# Patient Record
Sex: Female | Born: 1975 | Race: White | Hispanic: No | Marital: Married
Health system: Southern US, Community
[De-identification: ages and names within clinical notes are randomized; demographics above are authoritative.]

---

## 2000-04-08 ENCOUNTER — Emergency Department (HOSPITAL_COMMUNITY): Admission: EM | Admit: 2000-04-08 | Discharge: 2000-04-08 | Payer: Self-pay | Admitting: Emergency Medicine

## 2003-01-06 ENCOUNTER — Other Ambulatory Visit: Admission: RE | Admit: 2003-01-06 | Discharge: 2003-01-06 | Payer: Self-pay | Admitting: Gynecology

## 2003-06-01 ENCOUNTER — Encounter: Admission: RE | Admit: 2003-06-01 | Discharge: 2003-06-01 | Payer: Self-pay | Admitting: Gynecology

## 2003-08-03 ENCOUNTER — Inpatient Hospital Stay (HOSPITAL_COMMUNITY): Admission: AD | Admit: 2003-08-03 | Discharge: 2003-08-07 | Payer: Self-pay | Admitting: Gynecology

## 2003-09-17 ENCOUNTER — Other Ambulatory Visit: Admission: RE | Admit: 2003-09-17 | Discharge: 2003-09-17 | Payer: Self-pay | Admitting: Gynecology

## 2004-05-02 ENCOUNTER — Other Ambulatory Visit: Admission: RE | Admit: 2004-05-02 | Discharge: 2004-05-02 | Payer: Self-pay | Admitting: Gynecology

## 2004-07-05 ENCOUNTER — Inpatient Hospital Stay (HOSPITAL_COMMUNITY): Admission: AD | Admit: 2004-07-05 | Discharge: 2004-07-05 | Payer: Self-pay | Admitting: Obstetrics and Gynecology

## 2004-09-02 ENCOUNTER — Inpatient Hospital Stay (HOSPITAL_COMMUNITY): Admission: AD | Admit: 2004-09-02 | Discharge: 2004-09-02 | Payer: Self-pay | Admitting: Obstetrics and Gynecology

## 2004-09-03 ENCOUNTER — Inpatient Hospital Stay (HOSPITAL_COMMUNITY): Admission: AD | Admit: 2004-09-03 | Discharge: 2004-09-03 | Payer: Self-pay | Admitting: Obstetrics and Gynecology

## 2004-10-29 ENCOUNTER — Inpatient Hospital Stay (HOSPITAL_COMMUNITY): Admission: AD | Admit: 2004-10-29 | Discharge: 2004-11-01 | Payer: Self-pay | Admitting: Family Medicine

## 2008-10-28 ENCOUNTER — Encounter (INDEPENDENT_AMBULATORY_CARE_PROVIDER_SITE_OTHER): Payer: Self-pay | Admitting: Obstetrics and Gynecology

## 2008-10-28 ENCOUNTER — Ambulatory Visit (HOSPITAL_COMMUNITY): Admission: RE | Admit: 2008-10-28 | Discharge: 2008-10-29 | Payer: Self-pay | Admitting: Obstetrics and Gynecology

## 2010-05-26 LAB — CBC
HCT: 29.5 % — ABNORMAL LOW (ref 36.0–46.0)
HCT: 37.6 % (ref 36.0–46.0)
Hemoglobin: 10.1 g/dL — ABNORMAL LOW (ref 12.0–15.0)
Hemoglobin: 12.9 g/dL (ref 12.0–15.0)
MCHC: 34.2 g/dL (ref 30.0–36.0)
MCV: 90.4 fL (ref 78.0–100.0)
Platelets: 133 10*3/uL — ABNORMAL LOW (ref 150–400)
Platelets: 184 10*3/uL (ref 150–400)
RBC: 3.24 MIL/uL — ABNORMAL LOW (ref 3.87–5.11)
RDW: 14.4 % (ref 11.5–15.5)
WBC: 8.6 10*3/uL (ref 4.0–10.5)

## 2010-05-26 LAB — ABO/RH: ABO/RH(D): O POS

## 2010-07-07 NOTE — Discharge Summary (Signed)
NAME:  Lauren Nash, Lauren Nash       ACCOUNT NO.:  [id number]   MEDICAL RECORD NO.:  [id number]          PATIENT TYPE:  INP   LOCATION:  9306                          FACILITY:  [facility]   PHYSICIAN:  Ivor Costa. Farrel Gobble, M.D. DATE OF BIRTH:  Feb 16, 1976   DATE OF ADMISSION:  10/29/2004  DATE OF DISCHARGE:  11/01/2004                                 DISCHARGE SUMMARY   PRINCIPAL DIAGNOSIS:  Postpartum fever, suspect endometritis.   PRINCIPAL PROCEDURE:  Antibiotic therapy.   HISTORY AND HOSPITAL COURSE:  Refer to the history and physical, but, the  patient was admitted four days postpartum, status post primary Cesarean  section for twin/twin transfusion at [facility] who  presented to the hospital complaining of fever that began around 5 P.M.  after being discharged earlier that day.  The patient reported a temperature  of 101.7 but without any other symptoms.  The patient was initially  evaluated by teaching service and was started on triple antibiotics,  vancomycin, gentamicin and clindamycin.  The patient has been afebrile since  admission to the hospital, rapidly defervescing.  She had blood cultures  done which were negative.  She had a CBC with normal white count of 5.7. She  had a urine culture which was also negative.  As the patient was doing well  by hospital day #3 she was discharged home without antibiotics.  Her  postoperative examination was unremarkable with her abdomen being soft,  nontender.  Her uterus was firm, nontender, below the umbilicus.  Her  incision was intact with Steri-strips. Her extremities were nontender.   ASSESSMENT:  Suspected postpartum endometritis, treated with antibiotics  successfully.   DISPOSITION:  Patient is discharged home with instructions to resume her pre-  hospital prescriptions and follow up for anticipated postpartum examination.  If she develops fever again she knows to call for assessment.      Ivor Costa. Farrel Gobble,  M.D.  Electronically Signed     THL/MEDQ  D:  11/01/2004  T:  11/01/2004  Job:  [id number]

## 2019-04-18 ENCOUNTER — Ambulatory Visit: Payer: Self-pay | Attending: Internal Medicine

## 2019-04-18 DIAGNOSIS — Z23 Encounter for immunization: Secondary | ICD-10-CM | POA: Insufficient documentation

## 2019-04-18 NOTE — Progress Notes (Signed)
   Covid-19 Vaccination Clinic  Name:  Lauren Nash    MRN: [id number] DOB: 01-29-1976  04/18/2019  [name] was observed post Covid-19 immunization for 15 minutes without incidence. She was provided with Vaccine Information Sheet and instruction to access the V-Safe system.   Ms. Mask was instructed to call 911 with any severe reactions post vaccine: Marland Kitchen Difficulty breathing  . Swelling of your face and throat  . A fast heartbeat  . A bad rash all over your body  . Dizziness and weakness    Immunizations Administered    Name Date Dose VIS Date Route   Pfizer COVID-19 Vaccine 04/18/2019  4:31 PM 0.3 mL 01/30/2019 Intramuscular   Manufacturer: [employer], [employer]   Lot: [id number]   NDC: [address]-5

## 2019-05-09 ENCOUNTER — Ambulatory Visit: Payer: Self-pay | Attending: Internal Medicine

## 2019-05-09 DIAGNOSIS — Z23 Encounter for immunization: Secondary | ICD-10-CM

## 2019-05-09 NOTE — Progress Notes (Signed)
   Covid-19 Vaccination Clinic  Name:  ANIQUE BECKLEY    MRN: [id number] DOB: 28-Nov-1975  05/09/2019  [name] was observed post Covid-19 immunization for 15 minutes without incident. She was provided with Vaccine Information Sheet and instruction to access the V-Safe system.   Ms. Furukawa was instructed to call 911 with any severe reactions post vaccine: Marland Kitchen Difficulty breathing  . Swelling of face and throat  . A fast heartbeat  . A bad rash all over body  . Dizziness and weakness   Immunizations Administered    Name Date Dose VIS Date Route   Pfizer COVID-19 Vaccine 05/09/2019  3:47 PM 0.3 mL 01/30/2019 Intramuscular   Manufacturer: [employer], [employer]   Lot: [id number]   NDC: [address]-5

## 2020-08-24 ENCOUNTER — Other Ambulatory Visit: Payer: Self-pay | Admitting: Obstetrics and Gynecology

## 2020-08-24 DIAGNOSIS — N63 Unspecified lump in unspecified breast: Secondary | ICD-10-CM

## 2020-09-06 ENCOUNTER — Ambulatory Visit
Admission: RE | Admit: 2020-09-06 | Discharge: 2020-09-06 | Disposition: A | Discharge status: Home / Self Care | Payer: BC Managed Care – PPO | Source: Ambulatory Visit | Attending: Obstetrics and Gynecology | Admitting: Obstetrics and Gynecology

## 2020-09-06 ENCOUNTER — Other Ambulatory Visit: Payer: Self-pay | Admitting: Obstetrics and Gynecology

## 2020-09-06 ENCOUNTER — Ambulatory Visit
Admission: RE | Admit: 2020-09-06 | Discharge: 2020-09-06 | Disposition: A | Discharge status: Home / Self Care | Payer: Self-pay | Source: Ambulatory Visit | Attending: Obstetrics and Gynecology | Admitting: Obstetrics and Gynecology

## 2020-09-06 ENCOUNTER — Other Ambulatory Visit: Payer: Self-pay

## 2020-09-06 DIAGNOSIS — N63 Unspecified lump in unspecified breast: Secondary | ICD-10-CM

## 2020-09-07 ENCOUNTER — Ambulatory Visit
Admission: RE | Admit: 2020-09-07 | Discharge: 2020-09-07 | Disposition: A | Discharge status: Home / Self Care | Payer: BC Managed Care – PPO | Source: Ambulatory Visit | Attending: Obstetrics and Gynecology | Admitting: Obstetrics and Gynecology

## 2020-09-07 DIAGNOSIS — N63 Unspecified lump in unspecified breast: Secondary | ICD-10-CM

## 2021-05-11 ENCOUNTER — Other Ambulatory Visit: Payer: Self-pay | Admitting: Nurse Practitioner

## 2021-05-11 DIAGNOSIS — N6320 Unspecified lump in the left breast, unspecified quadrant: Secondary | ICD-10-CM

## 2021-05-26 ENCOUNTER — Other Ambulatory Visit: Payer: Self-pay | Admitting: Nurse Practitioner

## 2021-05-26 ENCOUNTER — Ambulatory Visit
Admission: RE | Admit: 2021-05-26 | Discharge: 2021-05-26 | Disposition: A | Discharge status: Home / Self Care | Payer: BC Managed Care – PPO | Source: Ambulatory Visit | Attending: Nurse Practitioner | Admitting: Nurse Practitioner

## 2021-05-26 DIAGNOSIS — N6320 Unspecified lump in the left breast, unspecified quadrant: Secondary | ICD-10-CM

## 2021-06-02 ENCOUNTER — Ambulatory Visit
Admission: RE | Admit: 2021-06-02 | Discharge: 2021-06-02 | Disposition: A | Discharge status: Home / Self Care | Payer: BC Managed Care – PPO | Source: Ambulatory Visit | Attending: Nurse Practitioner | Admitting: Nurse Practitioner

## 2021-06-02 DIAGNOSIS — N6320 Unspecified lump in the left breast, unspecified quadrant: Secondary | ICD-10-CM

## 2022-08-21 IMAGING — US US  BREAST BX W/ LOC DEV 1ST LESION IMG BX SPEC US GUIDE*R*
1 series · 8 of 8 positions shown · non-contrast
Comparison: Previous exam(s).
COMPARISON: Previous exam(s).
COMPARISON: Previous exam(s).

Addendum:
CLINICAL DATA: Ultrasound-guided biopsy a right breast mass at 12
o'clock.

EXAM:
ULTRASOUND GUIDED RIGHT BREAST CORE NEEDLE BIOPSY

[Series 1: us breast bx w/ loc dev 1st lesion img bx spec us  · 0.07mm/px · 8 of 8 slices shown]
[im 1/8]
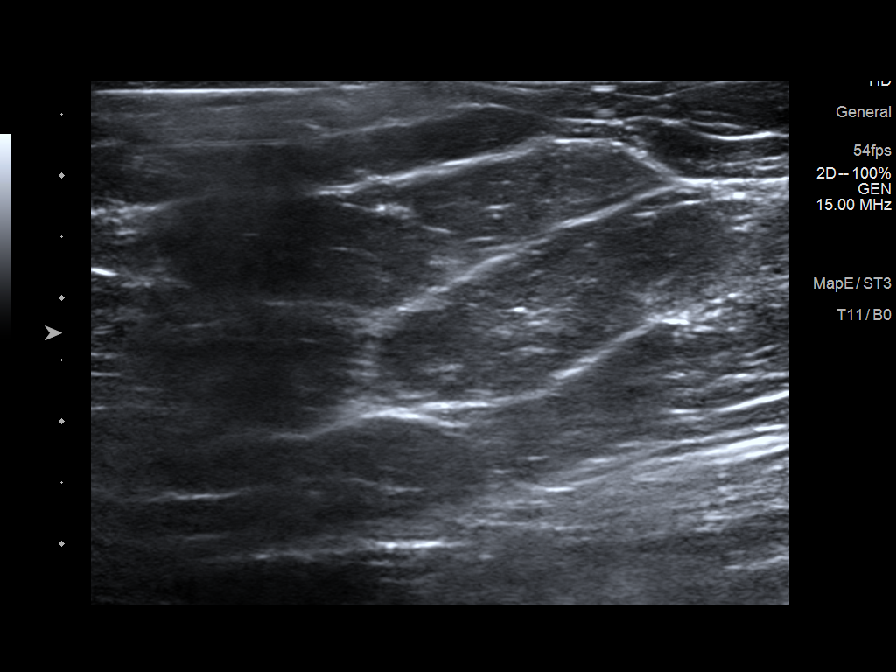
[im 2/8]
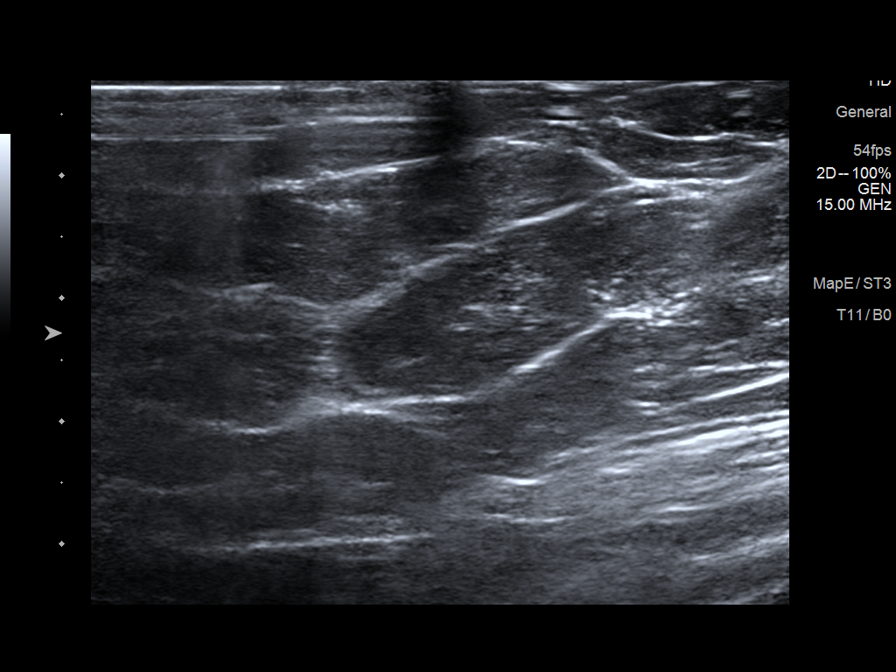
[im 3/8]
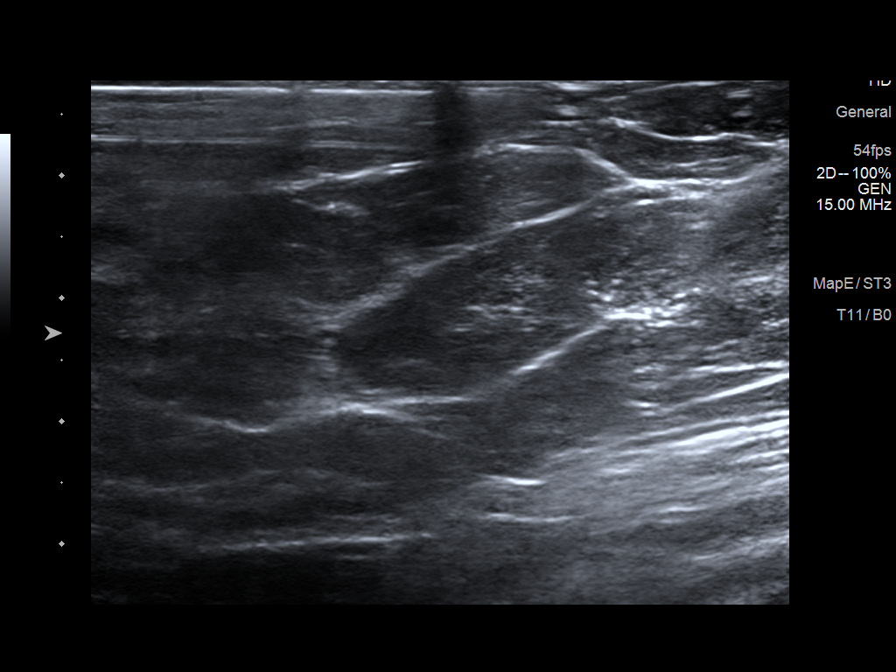
[im 4/8]
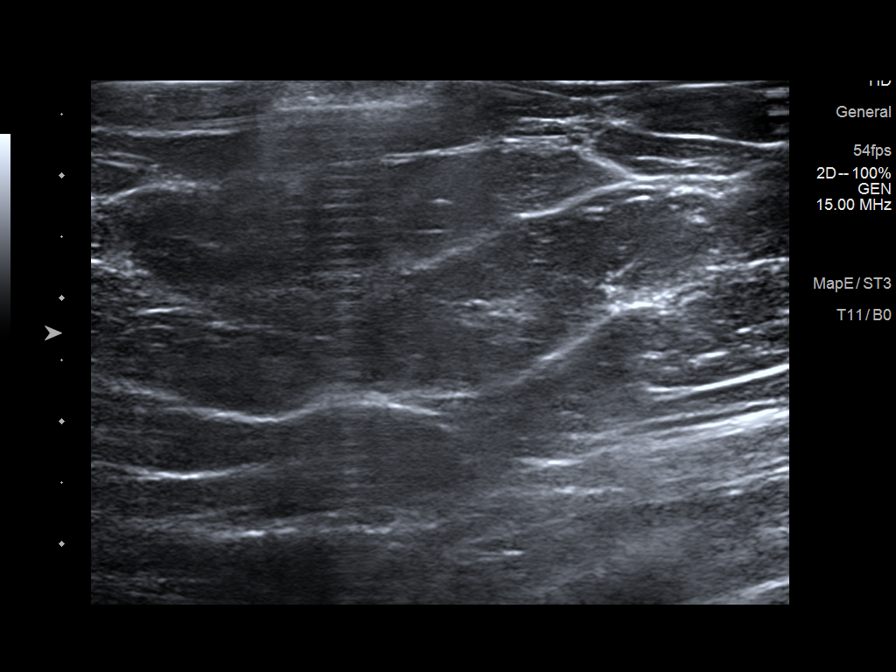
[im 5/8]
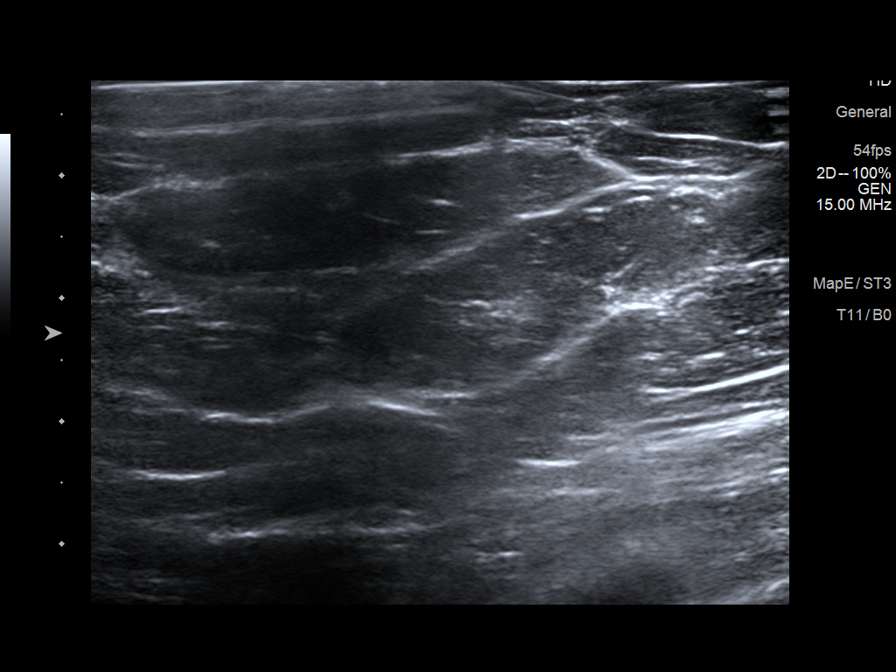
[im 6/8]
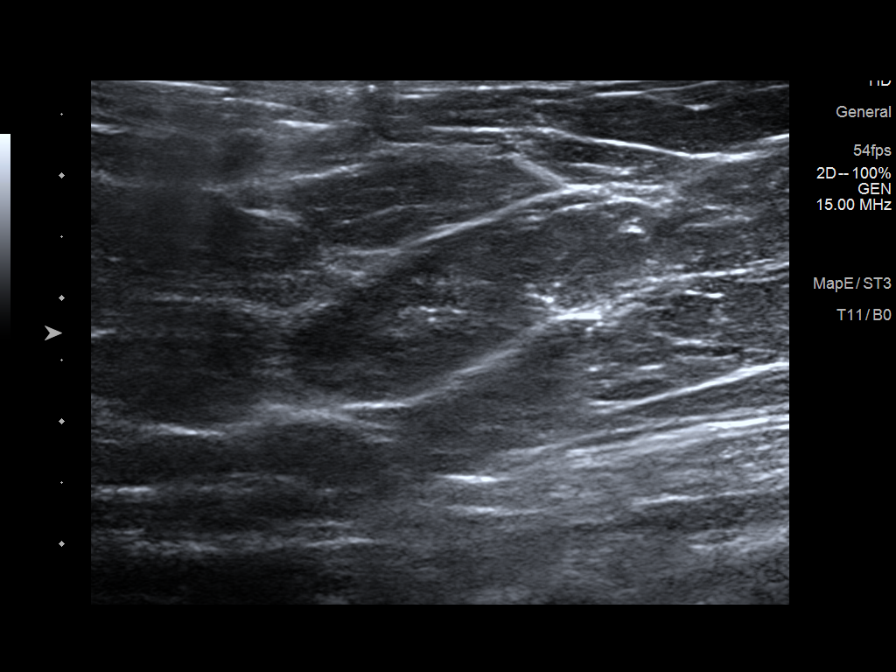
[im 7/8]
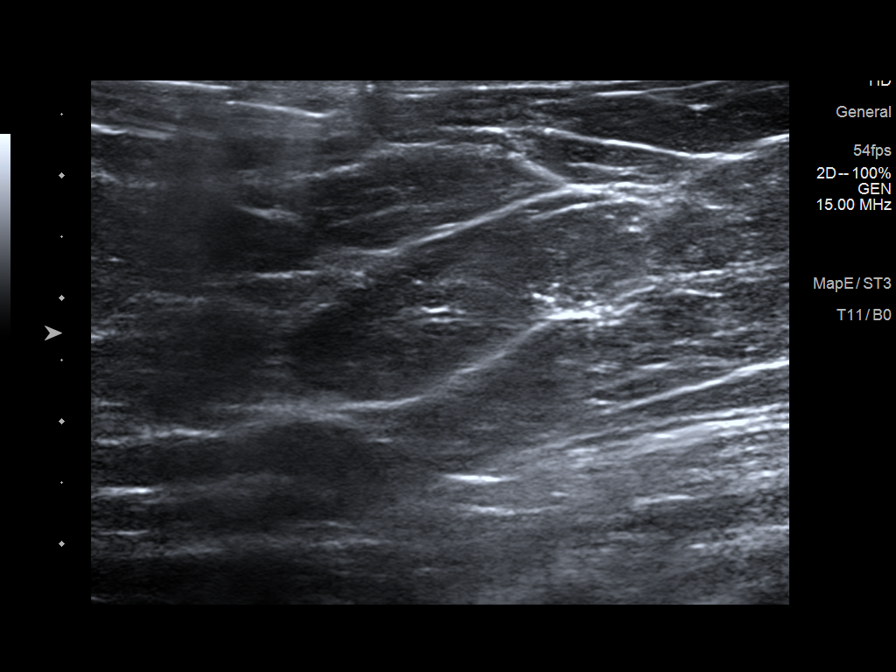
[im 8/8]
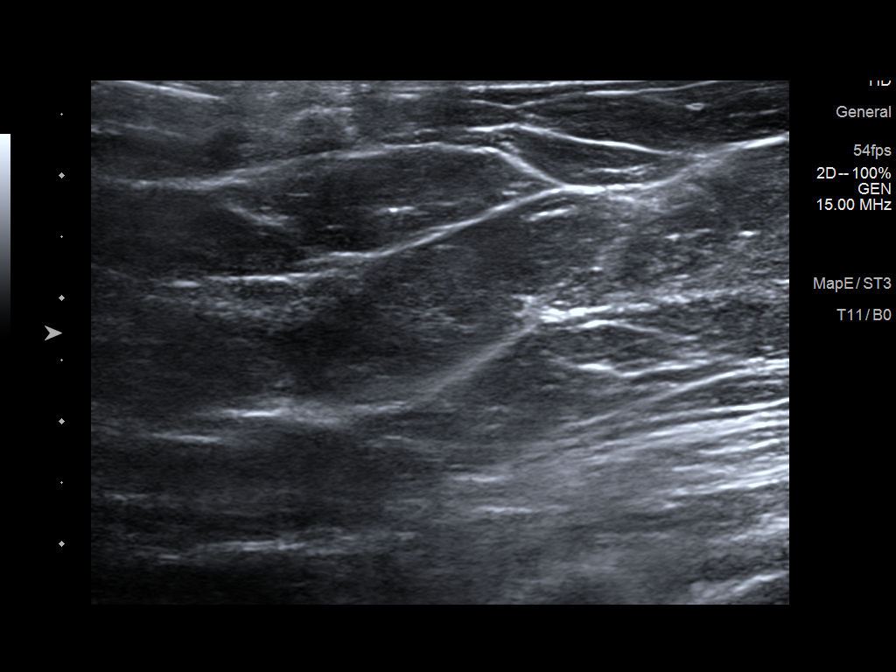

[8 of 8 positions shown; findings below may reference images not displayed]



Lesion quadrant: 12 o'clock right breast mass

Using sterile technique and 1% Lidocaine as local anesthetic, under
direct ultrasound visualization, a 12 gauge Enejo device was
used to perform biopsy of a 12 o'clock right breast mass using a
lateral approach. At the conclusion of the procedure a coil shaped
tissue marker clip was deployed into the biopsy cavity. Follow up 2
view mammogram was performed and dictated separately.
IMPRESSION: Ultrasound guided biopsy of a 12 o'clock right breast mass. No
apparent complications.

ADDENDUM:
A ribbon shaped biopsy clip was placed.

ADDENDUM:
Pathology revealed FAT NECROSIS of the RIGHT breast, 12 o'clock.
This was found to be concordant by Dr. Didiet Eka Zamroni.

Pathology results were discussed with the patient by telephone. The
patient reported doing well after the biopsy with tenderness at the
site. Post biopsy instructions and care were reviewed and questions
were answered. The patient was encouraged to call The [REDACTED]

The patient was instructed to return for annual screening
mammography and informed a reminder notice would be sent regarding
this appointment.

Pathology results reported by Nataly Serafin RN on 09/08/2020.

*** End of Addendum ***
Addendum:
PROCEDURE:
I met with the patient and we discussed the procedure of
ultrasound-guided biopsy, including benefits and alternatives. We
discussed the high likelihood of a successful procedure. We
discussed the risks of the procedure, including infection, bleeding,
tissue injury, clip migration, and inadequate sampling. Informed
written consent was given. The usual time-out protocol was performed
immediately prior to the procedure.

Lesion quadrant: 12 o'clock right breast mass

Using sterile technique and 1% Lidocaine as local anesthetic, under
direct ultrasound visualization, a 12 gauge Enejo device was
used to perform biopsy of a 12 o'clock right breast mass using a
lateral approach. At the conclusion of the procedure a coil shaped
tissue marker clip was deployed into the biopsy cavity. Follow up 2
view mammogram was performed and dictated separately.
IMPRESSION: Ultrasound guided biopsy of a 12 o'clock right breast mass. No
apparent complications.

ADDENDUM:
A ribbon shaped biopsy clip was placed.



Lesion quadrant: 12 o'clock right breast mass

Using sterile technique and 1% Lidocaine as local anesthetic, under
direct ultrasound visualization, a 12 gauge Enejo device was
used to perform biopsy of a 12 o'clock right breast mass using a
lateral approach. At the conclusion of the procedure a coil shaped
tissue marker clip was deployed into the biopsy cavity. Follow up 2
view mammogram was performed and dictated separately.
IMPRESSION: Ultrasound guided biopsy of a 12 o'clock right breast mass. No
apparent complications.

## 2023-05-16 IMAGING — MG MM BREAST LOCALIZATION CLIP
4 series · 4 of 12 positions shown · non-contrast
Comparison: Previous exam(s).

CLINICAL DATA: Post procedure mammogram for clip placement

EXAM:
3D DIAGNOSTIC LEFT MAMMOGRAM POST ULTRASOUND BIOPSY

[L ML synth-2D]
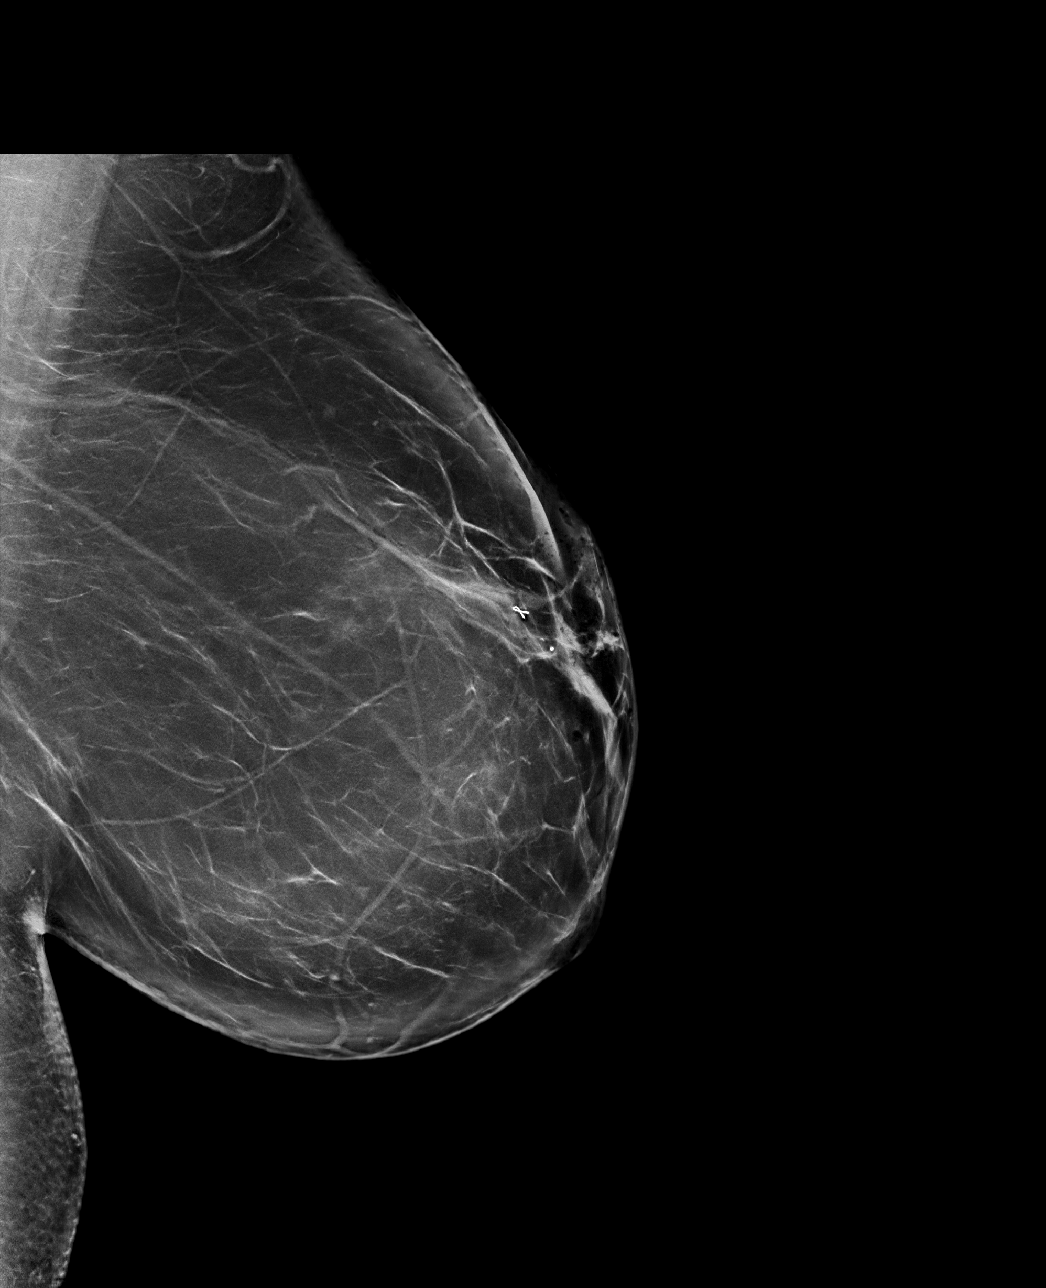

[L CC synth-2D]
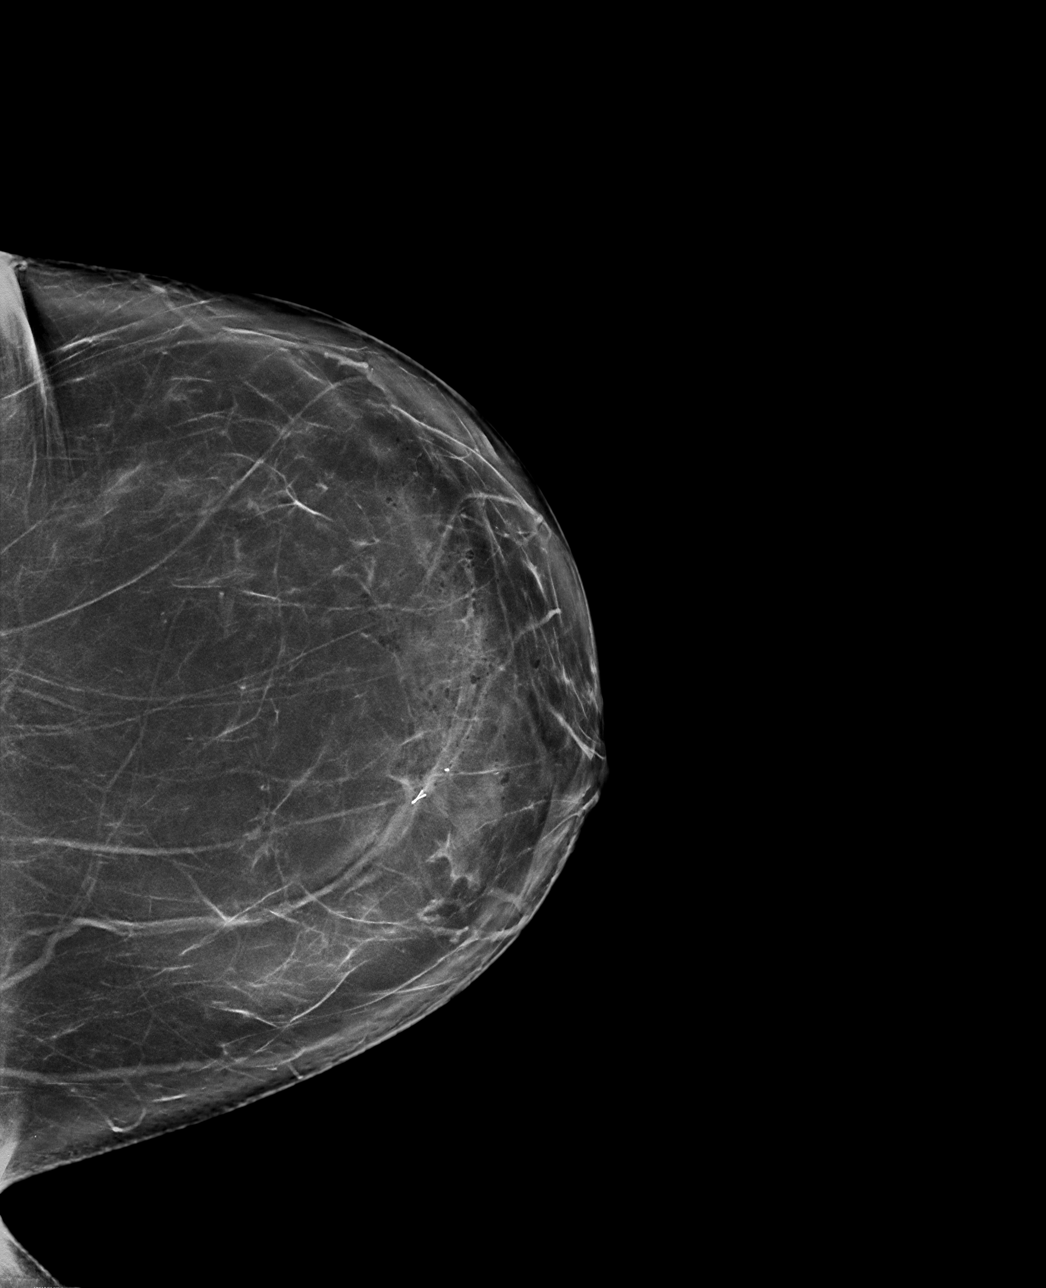

[L CC tomo · tomo slice 43/84.0]
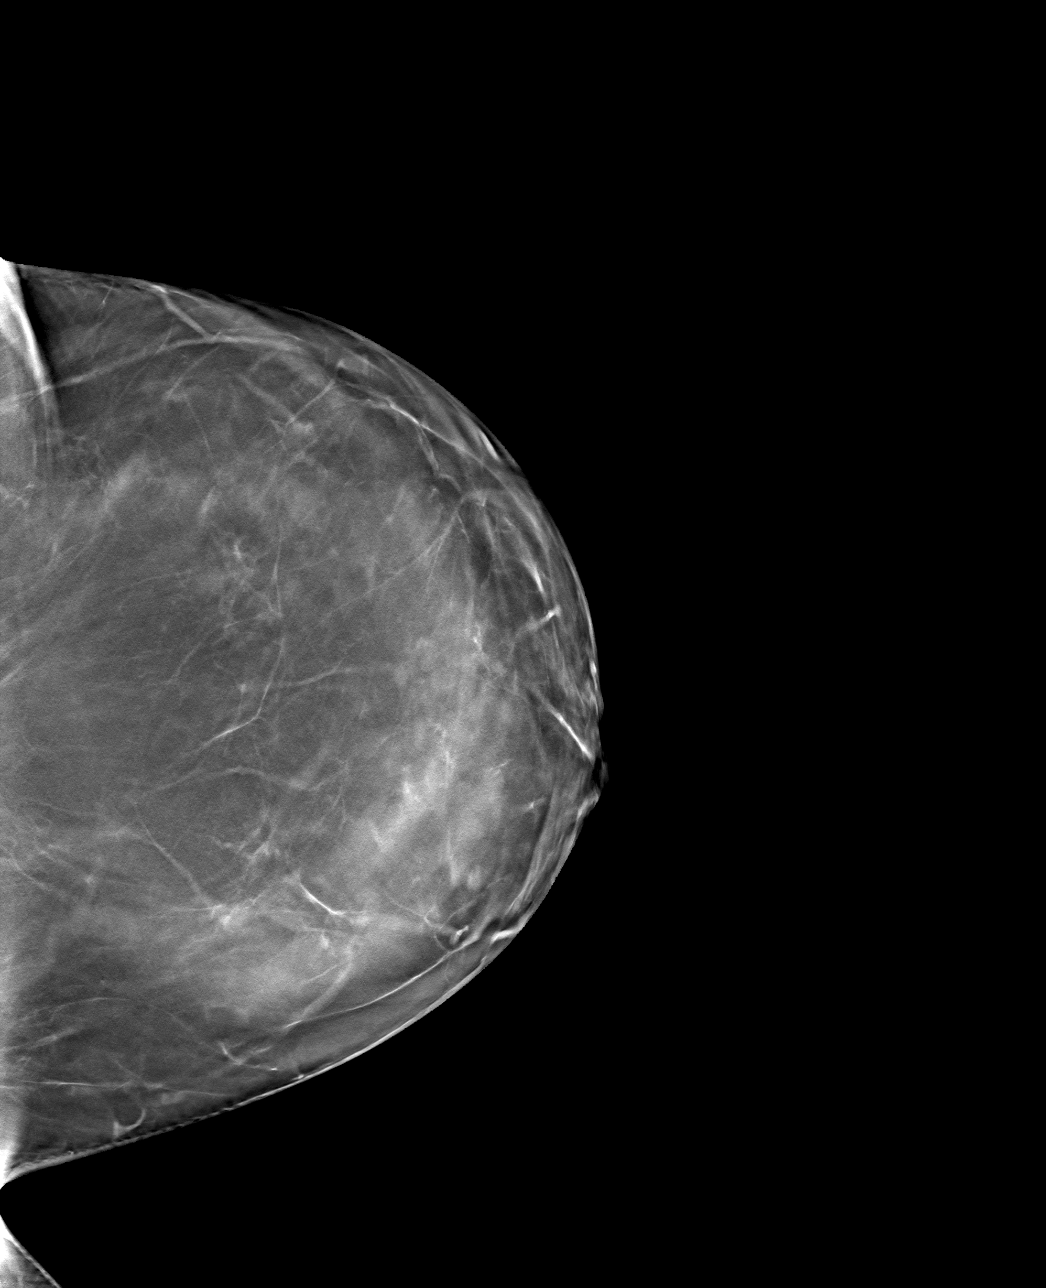

[L ML tomo · tomo slice 46/91.0]
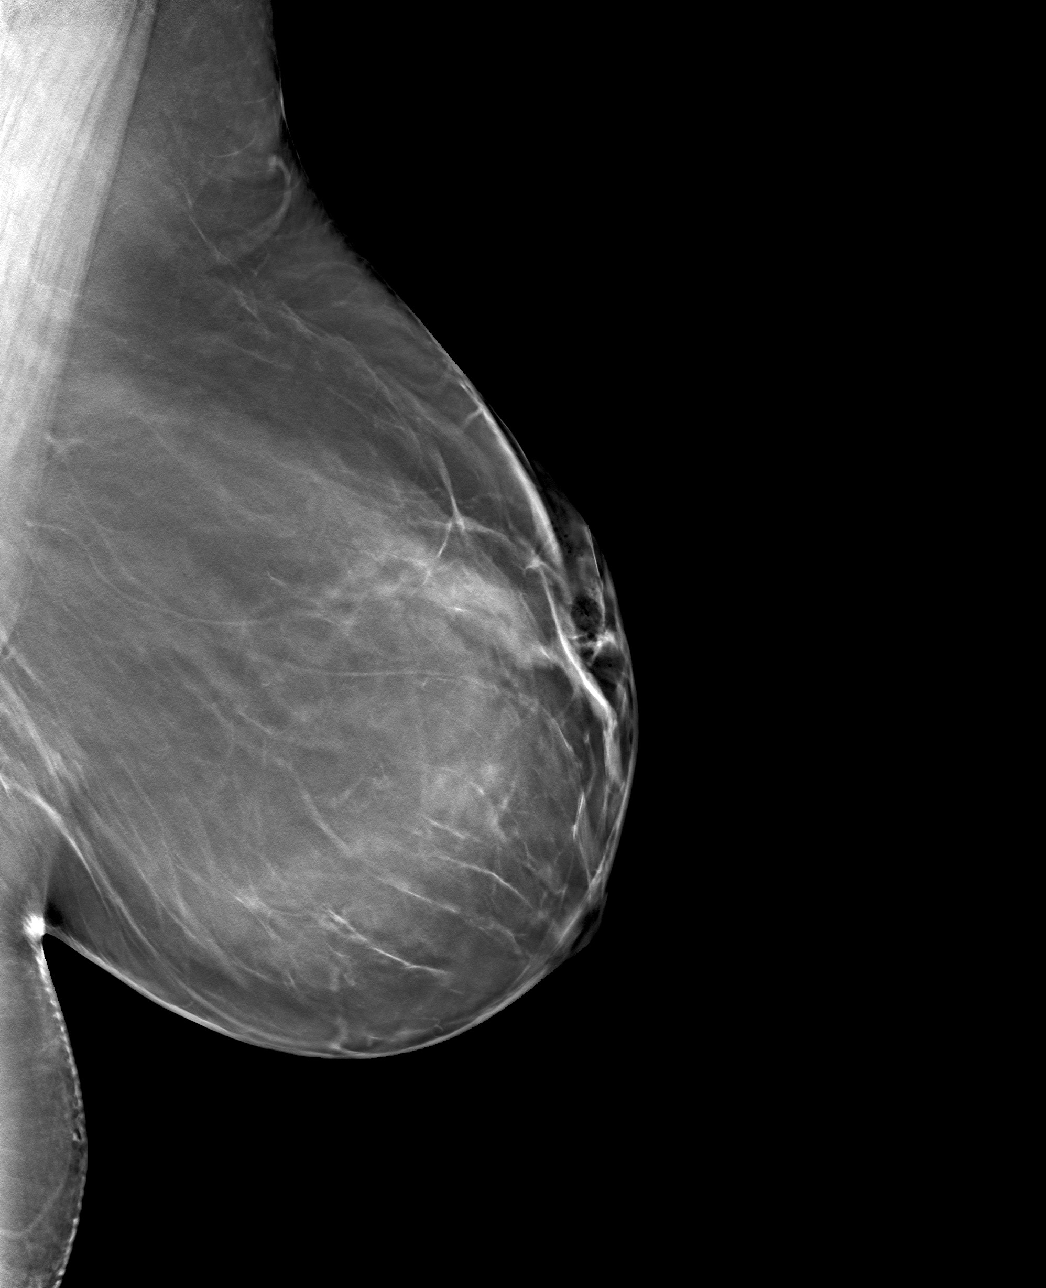

[4 of 12 positions shown; findings below may reference images not displayed]

FINDINGS: 3D Mammographic images were obtained following ultrasound guided
biopsy of a mass in the left breast at 11:30 o'clock 5 cm from the
nipple. The biopsy marking clip is in expected position at the site
of biopsy.
IMPRESSION: Appropriate positioning of the ribbon shaped biopsy marking clip at
the site of biopsy in the left breast at 11:30 o'clock.

Final Assessment: Post Procedure Mammograms for Marker Placement
